# Patient Record
Sex: Male | Born: 1955 | Race: White | Hispanic: No | Smoking: Former smoker
Health system: Southern US, Community
[De-identification: ages and names within clinical notes are randomized; demographics above are authoritative.]

## PROBLEM LIST (undated history)

## (undated) DIAGNOSIS — G35 Multiple sclerosis: Secondary | ICD-10-CM

## (undated) DIAGNOSIS — G259 Extrapyramidal and movement disorder, unspecified: Secondary | ICD-10-CM

## (undated) HISTORY — DX: Extrapyramidal and movement disorder, unspecified: G25.9

## (undated) HISTORY — DX: Multiple sclerosis: G35

---

## 2001-02-13 ENCOUNTER — Ambulatory Visit (HOSPITAL_COMMUNITY): Admission: RE | Admit: 2001-02-13 | Discharge: 2001-02-13 | Payer: Self-pay | Admitting: Preventative Medicine

## 2005-11-22 ENCOUNTER — Inpatient Hospital Stay (HOSPITAL_COMMUNITY): Admission: EM | Admit: 2005-11-22 | Discharge: 2006-02-06 | Payer: Self-pay | Admitting: Emergency Medicine

## 2005-12-12 ENCOUNTER — Ambulatory Visit: Payer: Self-pay | Admitting: Internal Medicine

## 2005-12-13 ENCOUNTER — Ambulatory Visit: Payer: Self-pay | Admitting: Critical Care Medicine

## 2005-12-15 ENCOUNTER — Encounter: Payer: Self-pay | Admitting: Emergency Medicine

## 2005-12-27 ENCOUNTER — Ambulatory Visit: Payer: Self-pay | Admitting: Physical Medicine & Rehabilitation

## 2006-01-19 ENCOUNTER — Ambulatory Visit: Payer: Self-pay | Admitting: Internal Medicine

## 2006-01-31 ENCOUNTER — Ambulatory Visit: Payer: Self-pay | Admitting: Gastroenterology

## 2007-06-14 IMAGING — CR DG CHEST 1V PORT
1 series · 1 of 1 positions shown · non-contrast
Comparison: none

HISTORY: Followup infiltrates, on ventilator

[view not recorded]
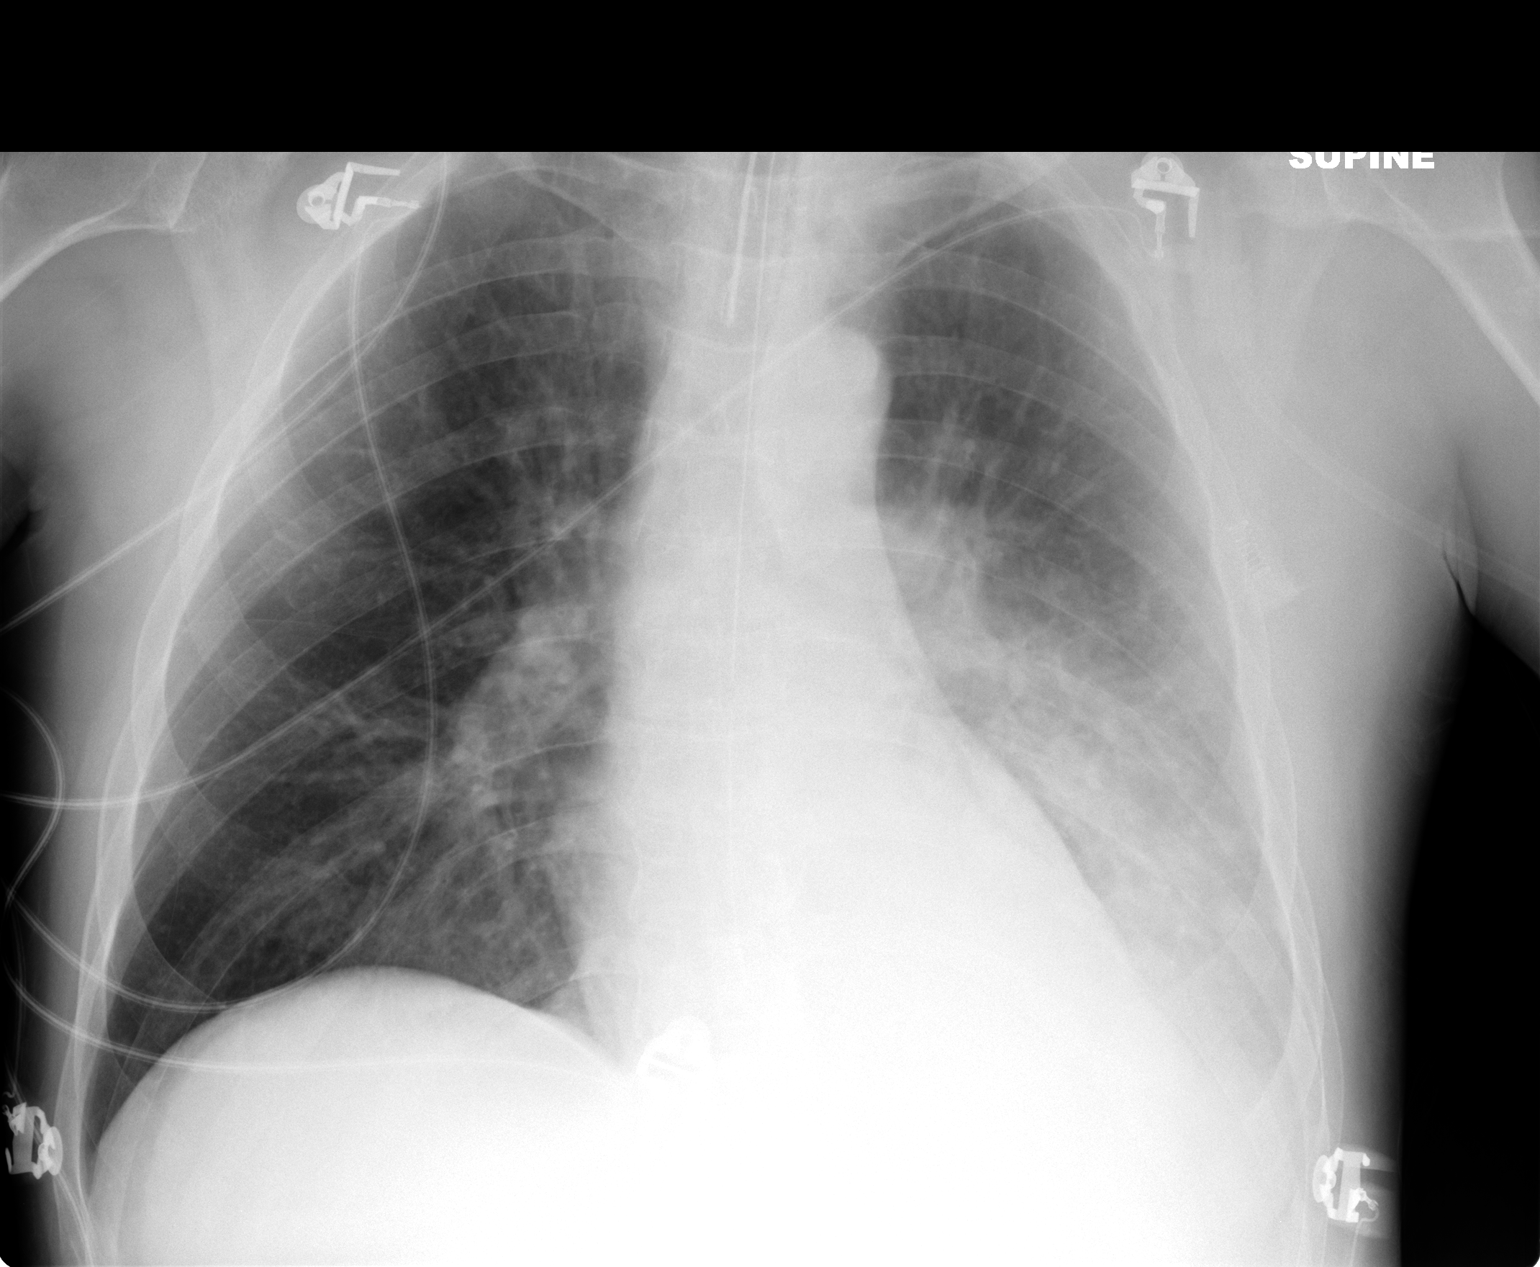

[1 of 1 positions shown; findings below may reference images not displayed]

PORTABLE CHEST ONE VIEW:

Portable exam 2782 hours compared to 11/28/2005

Endotracheal tube in satisfactory position above carina.
Nasogastric tube tip in distal esophagus, recommend advance into stomach.
Heart size stable.
Progressive consolidation left lower lobe.
Minimal atelectasis or infiltrate right base.
IMPRESSION: Recommend advance nasogastric tube into stomach.
Significant increase in left lower lobe consolidation since previous exam.
Minimal right basilar atelectasis or infiltrate.

## 2007-06-16 IMAGING — CR DG CHEST 1V PORT
1 series · 1 of 1 positions shown · non-contrast
Comparison: none

HISTORY: Respiratory failure, on ventilator

[view not recorded]
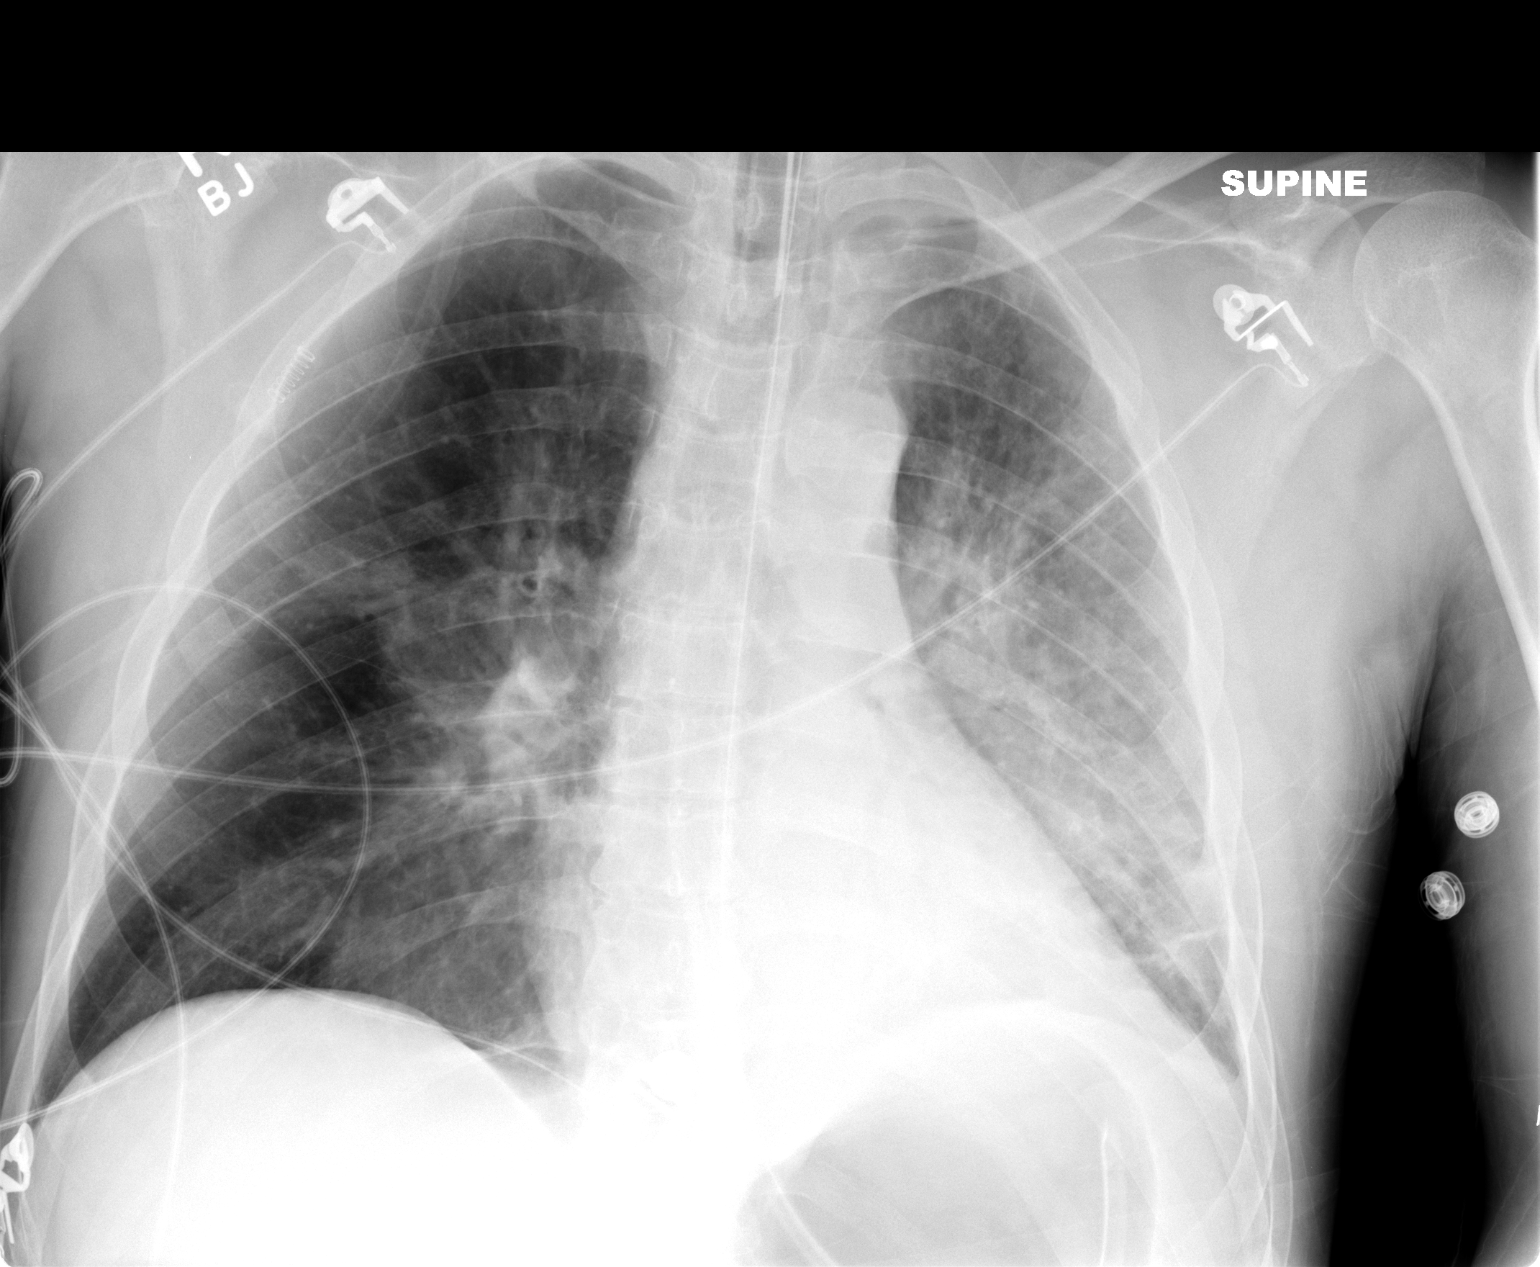

[1 of 1 positions shown; findings below may reference images not displayed]

PORTABLE CHEST ONE VIEW:

Portable exam 0036 hours compared to 11/30/2005

Endotracheal tube in satisfactory position, tip 7.3 cm above carina.
Nasogastric tube in stomach.
Heart size stable.
Bronchitic changes with persistent atelectasis right midlung.
Left lower lobe consolidation, slightly decreased.
Perihilar infiltrate unchanged in left lung.
IMPRESSION: Extensive left lung infiltrates, with slightly improved aeration left base.
Tip of the ET tube is 7.3 cm above carina.

## 2007-06-16 IMAGING — CR DG CHEST 1V PORT SAME DAY
1 series · 1 of 1 positions shown · non-contrast
Comparison: 2112 hours.

CLINICAL DATA: PICC placement.
 PORTABLE CHEST - 1 VIEW:

[view not recorded]
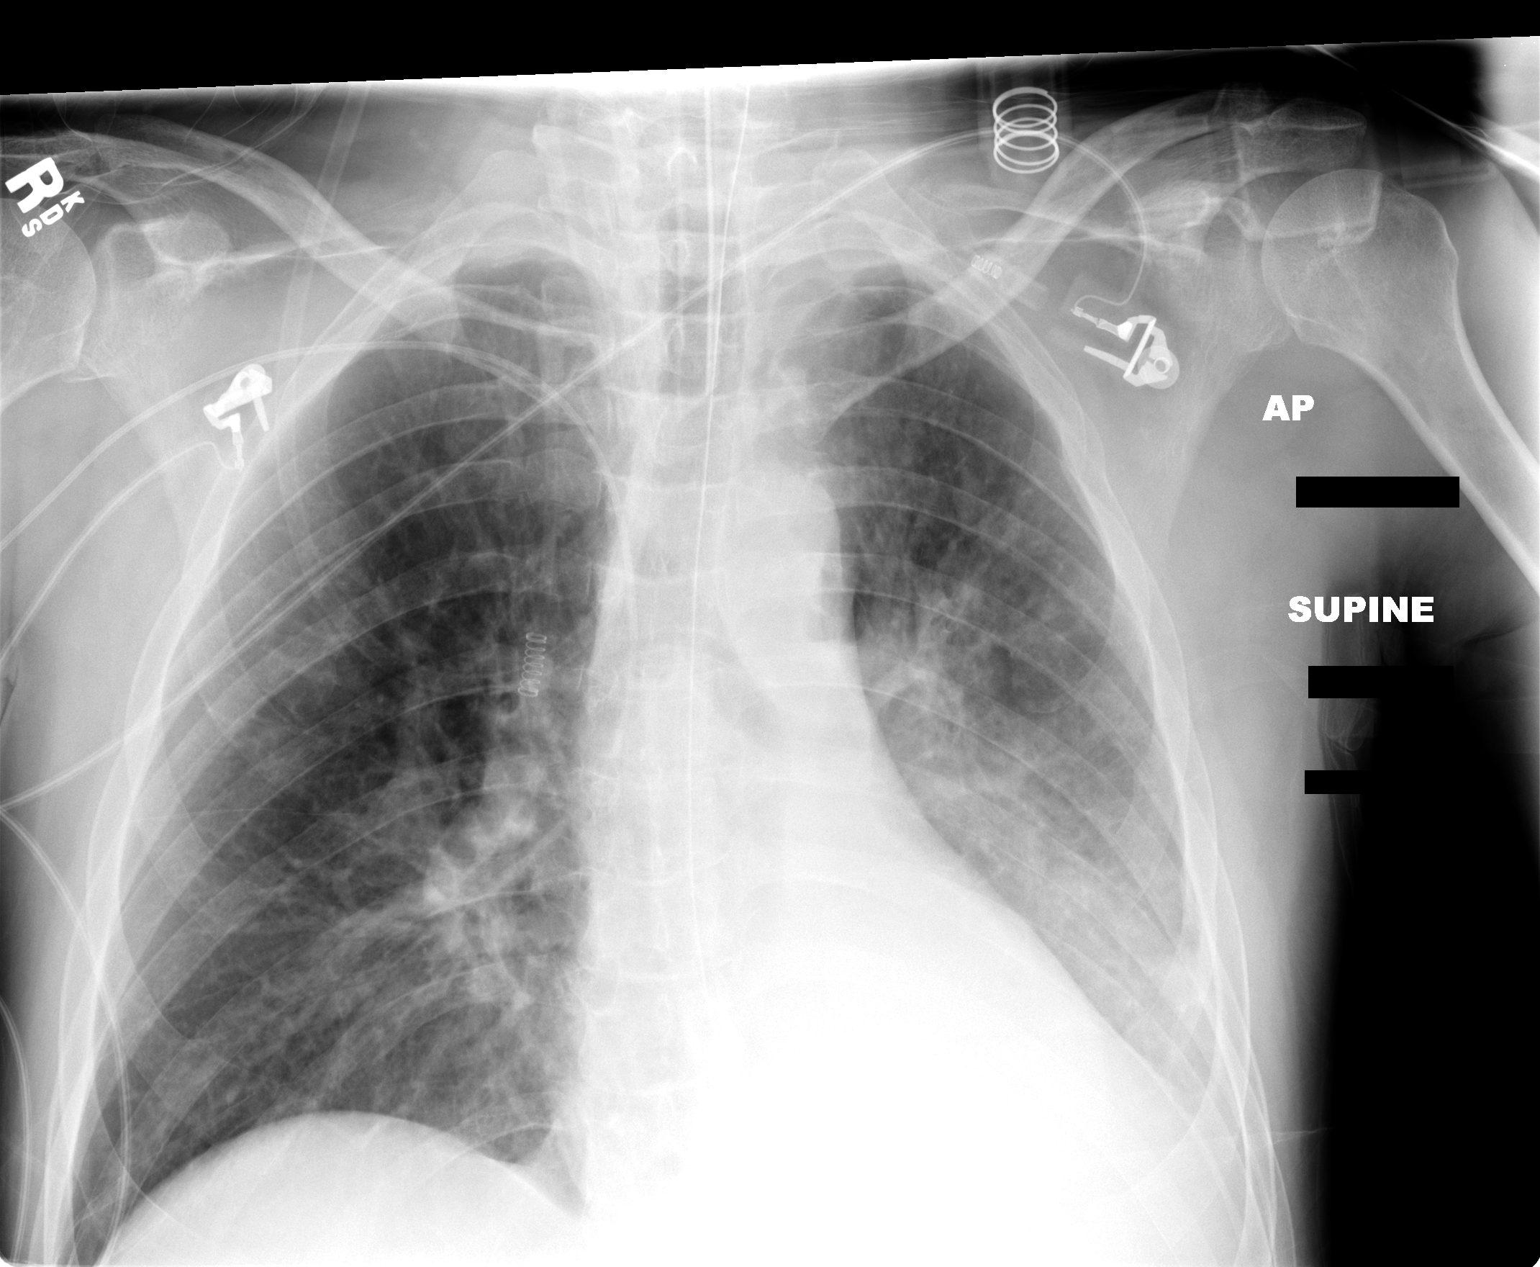

[1 of 1 positions shown; findings below may reference images not displayed]

FINDINGS: A right upper extremity PICC has been placed with its tip in the mid IVC.  There has been interval worsening of the left lower lobe airspace process, with obscuration of the left hemidiaphragm.  ET tube remains in good position.
IMPRESSION: Right upper extremity PICC placed to the mid SVC.  Interval worsening of left lower lobe airspace disease.

## 2007-06-19 IMAGING — CR DG CHEST 1V PORT
1 series · 1 of 1 positions shown · non-contrast
Comparison: 12/02/05

CLINICAL DATA: Altered level of consciousness.  
 PORTABLE CHEST- 1 VIEW (5255 hours):

[view not recorded]
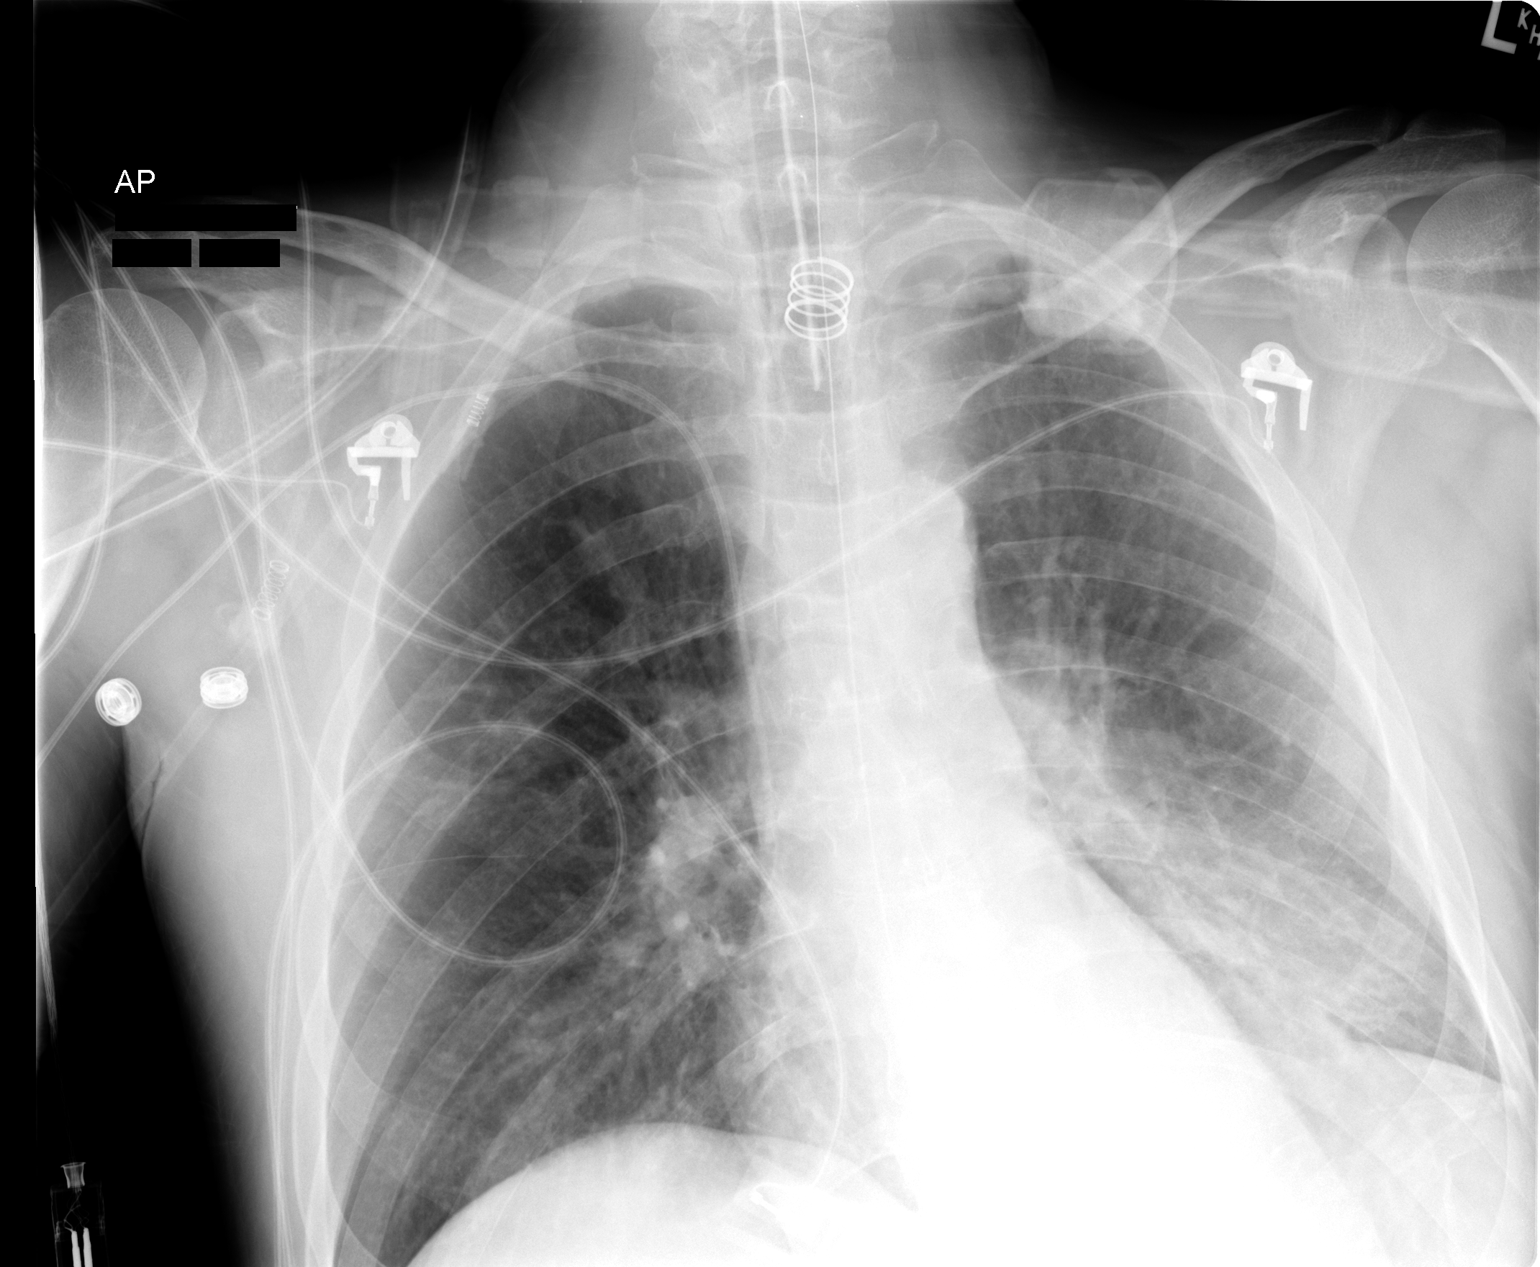

[1 of 1 positions shown; findings below may reference images not displayed]

FINDINGS: The tubular structures are stable.  Airspace disease has worsened in the left lower lobe.  The right lung is clear.  No pneumothoraces or effusions are seen.
IMPRESSION: Worsening left lower lobe airspace disease.

## 2007-07-25 IMAGING — CR DG CHEST 1V PORT
1 series · 1 of 1 positions shown · non-contrast
Comparison: 01/09/06.

CLINICAL DATA: PICC line placement on left side.
CHEST PORTABLE  - 11/13/04 AT 0002 HOURS:

[view not recorded]
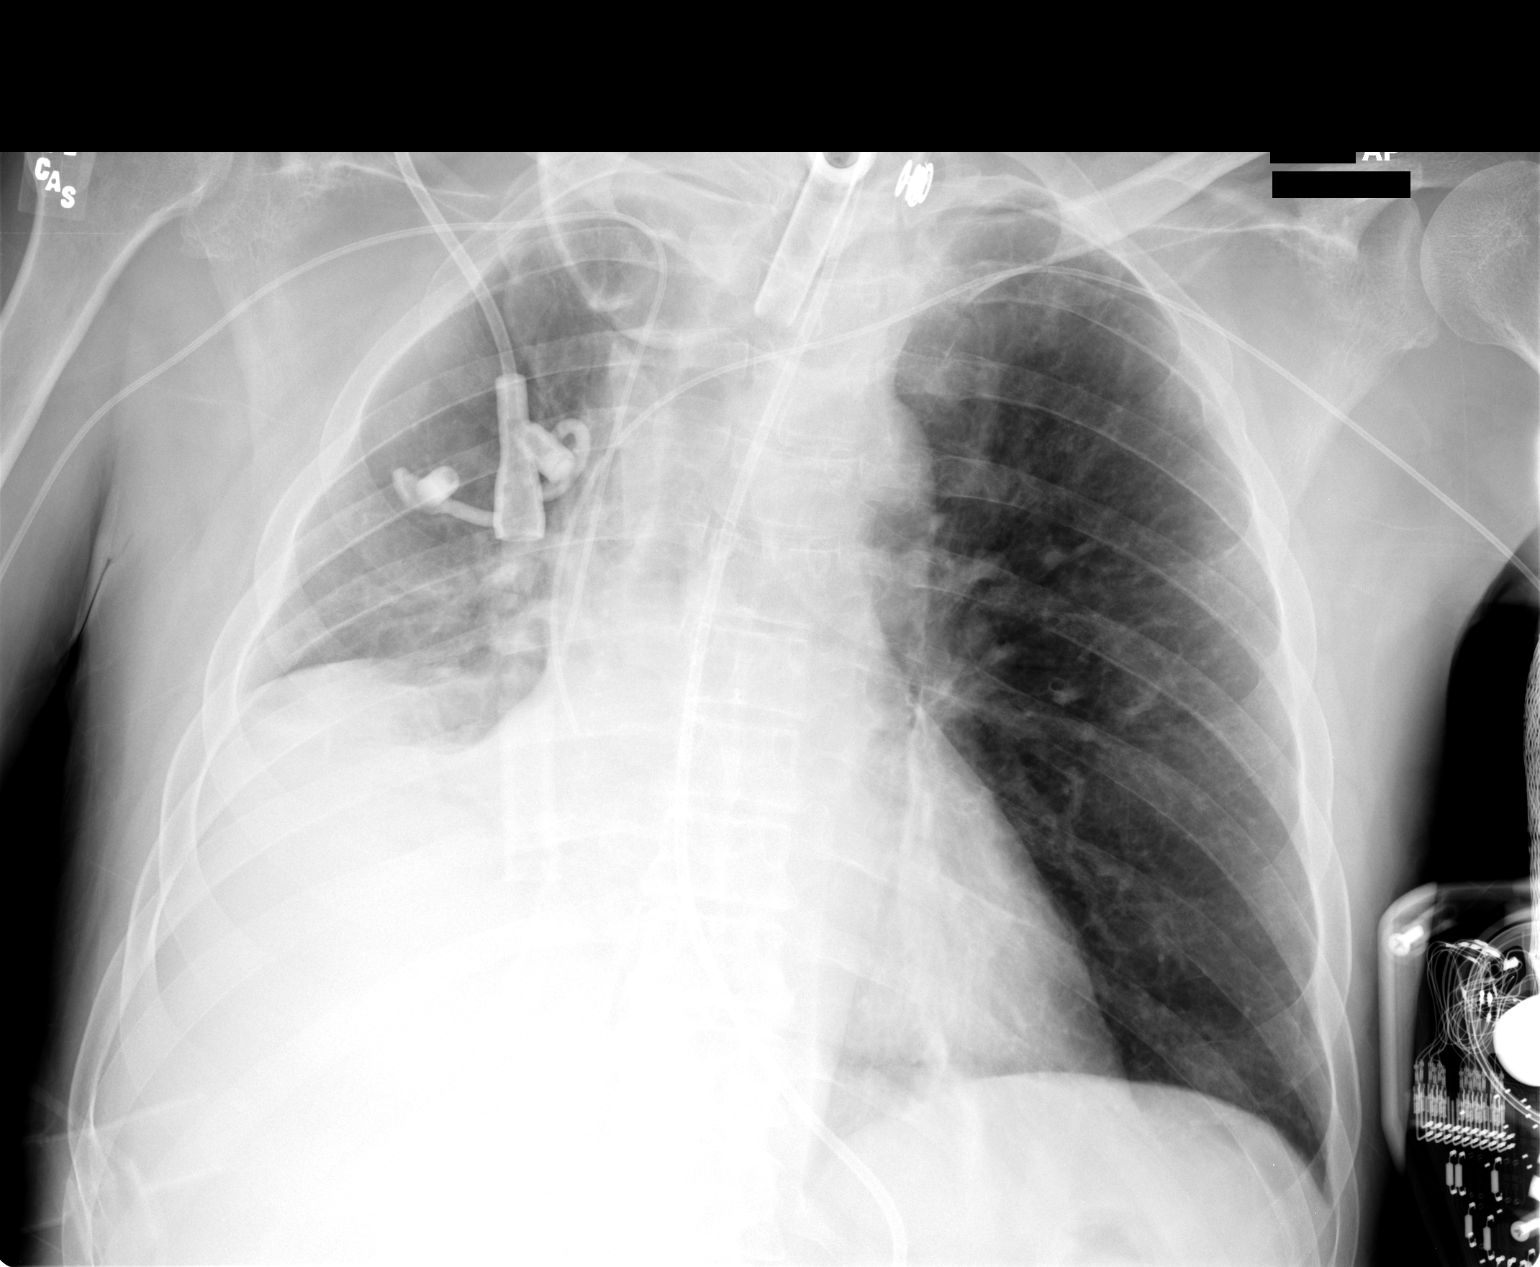

[1 of 1 positions shown; findings below may reference images not displayed]

FINDINGS: A new PICC line has been placed from the left side with the tip in the upper right atrium.  This could be withdrawn 3 cm for placement in the SVC.  The right arm PICC line tip is in the SVC.  
Right lower lobe collapse is again noted.  The left lung remains clear.  The tracheostomy tube is in good position.
IMPRESSION: Left arm PICC line placed into the right atrium, recommend withdrawal of 2-3 cm.

## 2010-10-10 ENCOUNTER — Encounter: Payer: Self-pay | Admitting: Internal Medicine

## 2017-01-22 DIAGNOSIS — K219 Gastro-esophageal reflux disease without esophagitis: Secondary | ICD-10-CM | POA: Diagnosis not present

## 2017-01-22 DIAGNOSIS — A419 Sepsis, unspecified organism: Secondary | ICD-10-CM

## 2017-01-22 DIAGNOSIS — R1310 Dysphagia, unspecified: Secondary | ICD-10-CM

## 2017-01-22 DIAGNOSIS — N390 Urinary tract infection, site not specified: Secondary | ICD-10-CM

## 2017-01-22 DIAGNOSIS — J449 Chronic obstructive pulmonary disease, unspecified: Secondary | ICD-10-CM

## 2017-01-23 DIAGNOSIS — R1310 Dysphagia, unspecified: Secondary | ICD-10-CM | POA: Diagnosis not present

## 2017-01-23 DIAGNOSIS — N390 Urinary tract infection, site not specified: Secondary | ICD-10-CM | POA: Diagnosis not present

## 2017-01-23 DIAGNOSIS — A419 Sepsis, unspecified organism: Secondary | ICD-10-CM | POA: Diagnosis not present

## 2017-01-23 DIAGNOSIS — J449 Chronic obstructive pulmonary disease, unspecified: Secondary | ICD-10-CM | POA: Diagnosis not present

## 2017-01-24 DIAGNOSIS — A419 Sepsis, unspecified organism: Secondary | ICD-10-CM | POA: Diagnosis not present

## 2017-01-24 DIAGNOSIS — N390 Urinary tract infection, site not specified: Secondary | ICD-10-CM | POA: Diagnosis not present

## 2017-01-24 DIAGNOSIS — J449 Chronic obstructive pulmonary disease, unspecified: Secondary | ICD-10-CM | POA: Diagnosis not present

## 2017-01-24 DIAGNOSIS — R1310 Dysphagia, unspecified: Secondary | ICD-10-CM | POA: Diagnosis not present

## 2017-01-25 DIAGNOSIS — N390 Urinary tract infection, site not specified: Secondary | ICD-10-CM | POA: Diagnosis not present

## 2017-01-25 DIAGNOSIS — R1310 Dysphagia, unspecified: Secondary | ICD-10-CM | POA: Diagnosis not present

## 2017-01-25 DIAGNOSIS — J449 Chronic obstructive pulmonary disease, unspecified: Secondary | ICD-10-CM | POA: Diagnosis not present

## 2017-01-25 DIAGNOSIS — A419 Sepsis, unspecified organism: Secondary | ICD-10-CM | POA: Diagnosis not present

## 2017-02-22 ENCOUNTER — Encounter: Payer: Self-pay | Admitting: Neurology

## 2017-03-21 ENCOUNTER — Ambulatory Visit: Payer: Medicare Other | Admitting: Neurology

## 2017-03-23 DIAGNOSIS — K219 Gastro-esophageal reflux disease without esophagitis: Secondary | ICD-10-CM | POA: Diagnosis not present

## 2017-03-23 DIAGNOSIS — G8220 Paraplegia, unspecified: Secondary | ICD-10-CM

## 2017-03-23 DIAGNOSIS — J441 Chronic obstructive pulmonary disease with (acute) exacerbation: Secondary | ICD-10-CM

## 2017-03-23 DIAGNOSIS — R1310 Dysphagia, unspecified: Secondary | ICD-10-CM | POA: Diagnosis not present

## 2017-03-23 DIAGNOSIS — J690 Pneumonitis due to inhalation of food and vomit: Secondary | ICD-10-CM

## 2017-03-23 DIAGNOSIS — J9621 Acute and chronic respiratory failure with hypoxia: Secondary | ICD-10-CM

## 2017-03-23 DIAGNOSIS — Z9189 Other specified personal risk factors, not elsewhere classified: Secondary | ICD-10-CM

## 2017-03-23 DIAGNOSIS — I4891 Unspecified atrial fibrillation: Secondary | ICD-10-CM | POA: Diagnosis not present

## 2017-03-24 DIAGNOSIS — Z9189 Other specified personal risk factors, not elsewhere classified: Secondary | ICD-10-CM | POA: Diagnosis not present

## 2017-03-24 DIAGNOSIS — J441 Chronic obstructive pulmonary disease with (acute) exacerbation: Secondary | ICD-10-CM | POA: Diagnosis not present

## 2017-03-24 DIAGNOSIS — J9621 Acute and chronic respiratory failure with hypoxia: Secondary | ICD-10-CM | POA: Diagnosis not present

## 2017-03-24 DIAGNOSIS — J690 Pneumonitis due to inhalation of food and vomit: Secondary | ICD-10-CM | POA: Diagnosis not present

## 2017-03-25 DIAGNOSIS — J9621 Acute and chronic respiratory failure with hypoxia: Secondary | ICD-10-CM | POA: Diagnosis not present

## 2017-03-25 DIAGNOSIS — Z9189 Other specified personal risk factors, not elsewhere classified: Secondary | ICD-10-CM | POA: Diagnosis not present

## 2017-03-25 DIAGNOSIS — J690 Pneumonitis due to inhalation of food and vomit: Secondary | ICD-10-CM | POA: Diagnosis not present

## 2017-03-25 DIAGNOSIS — J441 Chronic obstructive pulmonary disease with (acute) exacerbation: Secondary | ICD-10-CM | POA: Diagnosis not present

## 2017-03-29 ENCOUNTER — Encounter: Payer: Self-pay | Admitting: Neurology

## 2017-03-29 ENCOUNTER — Other Ambulatory Visit: Payer: Medicare Other

## 2017-03-29 ENCOUNTER — Ambulatory Visit (INDEPENDENT_AMBULATORY_CARE_PROVIDER_SITE_OTHER): Payer: Medicare Other | Admitting: Neurology

## 2017-03-29 VITALS — BP 100/58 | HR 96 | Ht 68.0 in | Wt 157.0 lb

## 2017-03-29 DIAGNOSIS — G35 Multiple sclerosis: Secondary | ICD-10-CM

## 2017-03-29 DIAGNOSIS — G8320 Monoplegia of upper limb affecting unspecified side: Secondary | ICD-10-CM

## 2017-03-29 DIAGNOSIS — K5909 Other constipation: Secondary | ICD-10-CM | POA: Diagnosis not present

## 2017-03-29 DIAGNOSIS — N319 Neuromuscular dysfunction of bladder, unspecified: Secondary | ICD-10-CM

## 2017-03-29 DIAGNOSIS — G8220 Paraplegia, unspecified: Secondary | ICD-10-CM | POA: Diagnosis not present

## 2017-03-29 NOTE — Progress Notes (Signed)
NEUROLOGY CONSULTATION NOTE  Barry Hernandez MRN: [id number] DOB: 10/01/1955  Referring provider: Dr. Ardelle Park Primary care provider: Dr. Ardelle Park  Reason for consult:  Multiple sclerosis  HISTORY OF PRESENT ILLNESS: Barry Hernandez is a 61 year old right-handed male with atrial fibrillation, HTN and hyperlipidemia who presents for multiple sclerosis.  History supplemented by PCP note.  He was diagnosed with MS at age 42.  He initially presented with difficulty climbing stairs.  He subsequently required a cane, then walker and then wheelchair.  He underwent an MRI of the brain and was told by a neurologist that he had progressive MS.  He does not recall having a lumbar puncture.  He never followed up with neurology and has never been on disease modifying therapy.  He has had gradually decline with loss of function.  He has been living at [facility] for the past 11 years.  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  He is non-ambulatory and relies on a motorized wheelchair for the past 10 years.  He has neurogenic bladder.  He has dysphagia with prior PEG placement.  For pain due to muscle spasms, he takes baclofen 20mg , gabapentin 300mg  three times daily and Norco. A modified barium swallow from May revealed no evidence of aspiration and is currently on a regular diet with thin liquids.  He is also followed by speech therapy as well. For neurogenic bladder, he takes urecholine 50mg  TID and Flomax 0.4mg  BID.  Due to this, he has recurrent cystitis.  He is followed by urology.  PAST MEDICAL HISTORY: Past Medical History:  Diagnosis Date  . Movement disorder   . Multiple sclerosis (HCC)     PAST SURGICAL HISTORY: No past surgical history on file.  MEDICATIONS: No current outpatient prescriptions on file prior to visit.   No current facility-administered medications on file prior to visit.     ALLERGIES: Not on File  FAMILY HISTORY: Family History    Problem Relation Age of Onset  . Cancer - Other Mother        brain  . Cancer - Other Father        throat    SOCIAL HISTORY: Social History   Social History  . Marital status: Divorced    Spouse name: N/A  . Number of children: N/A  . Years of education: N/A   Occupational History  . Not on file.   Social History Main Topics  . Smoking status: Former Smoker    Types: Cigarettes  . Smokeless tobacco: Never Used  . Alcohol use Not on file  . Drug use: Unknown  . Sexual activity: Not on file   Other Topics Concern  . Not on file   Social History Narrative  . No narrative on file    REVIEW OF SYSTEMS: Constitutional: No fevers, chills, or sweats, no generalized fatigue, change in appetite Eyes: No visual changes, double vision, eye pain Ear, nose and throat: No hearing loss, ear pain, nasal congestion, sore throat Cardiovascular: No chest pain, palpitations Respiratory:  No shortness of breath at rest or with exertion, wheezes GastrointestinaI: constipation Genitourinary:  Neurogenic bladder Musculoskeletal:  Spasticity, pain Integumentary: No rash, pruritus, skin lesions Neurological: as above Psychiatric: No depression, insomnia, anxiety Endocrine: No palpitations, fatigue, diaphoresis, mood swings, change in appetite, change in weight, increased thirst Hematologic/Lymphatic:  No purpura, petechiae. Allergic/Immunologic: no itchy/runny eyes, nasal congestion, recent allergic reactions, rashes  PHYSICAL EXAM: Vitals:   03/29/17 0755  BP: (!) 100/58  Pulse: 96   General: No acute distress.   Head:  Normocephalic/atraumatic Eyes:  fundi examined but not visualized Neck: supple, no paraspinal tenderness, full range of motion Back: No paraspinal tenderness Heart: regular rate and rhythm Lungs: Clear to auscultation bilaterally. Vascular: No carotid bruits. Neurological Exam: Mental status: alert and oriented to person and place (knew month but said year  was 1807), recent and remote memory fair, fund of knowledge intact, attention and concentration intact, speech fluent with spastic dysarthria, language intact. Cranial nerves: CN I: not tested CN II: pupils equal, round and reactive to light, visual fields intact CN III, IV, VI:  full range of motion, no nystagmus, no ptosis CN V: facial sensation intact CN VII: upper and lower face symmetric CN VIII: hearing intact CN IX, X: gag intact, uvula midline CN XI: sternocleidomastoid and trapezius muscles intact CN XII: tongue midline Bulk & Tone: increased tone and spasticity in right upper extremity and lower extremities, no fasciculations. Motor:  Right upper extremity contracture with arm flexed, 5/5 left upper extremity, 1/5 bilateral lower extremities Sensation: temperature and vibration sensation reduced in lower extremities. Deep Tendon Reflexes:  3+ throughout, toes downgoing. Finger to nose testing:  Left upper extremity with tremor but not dysmetria.  Unable to assess right upper extremity Heel to shin:  Unable to assess Gait:  Non-ambulatory.  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  To help verify diagnosis, we will get MRI of brain/cervical/thoracic spine with and without contrast 2.  I want to see him back after testing to discuss treatment options.  He may be a candidate of Ocrevus, which is a disease modifying therapy indicated for primary progressive MS. 3.  Continue gabapentin and baclofen for spasticity and pain.  Botox is an option for his right upper extremity, but this may not be an option as he is on anticoagulation for Afib (may require discontinuing medication for a couple of days prior to injection, would have to check) 4.  We will also check vitamin D level 5.  Treatment for neurogenic  bladder/BPH as per urology.  Thank you for allowing me to take part in the care of this patient.  Shon Millet, DO  CC:  Shelbie Ammons, MD

## 2017-03-29 NOTE — Patient Instructions (Signed)
1.  I want to get MRI of brain, cervical and thoracic spine with and without contrast. 2.  I want to check vitamin D level 3.  I want to see you back after MRI to discuss further management.

## 2017-04-01 LAB — VITAMIN D 1,25 DIHYDROXY
Vitamin D 1, 25 (OH)2 Total: 34 pg/mL (ref 18–72)
Vitamin D2 1, 25 (OH)2: <8 pg/mL
Vitamin D3 1, 25 (OH)2: 34 pg/mL

## 2017-04-03 ENCOUNTER — Other Ambulatory Visit: Payer: Self-pay | Admitting: *Deleted

## 2017-04-03 ENCOUNTER — Telehealth: Payer: Self-pay | Admitting: *Deleted

## 2017-04-03 MED ORDER — VITAMIN D3 50 MCG (2000 UT) PO TABS: 1.0000 | ORAL_TABLET | Freq: Every day | ORAL | 2 refills | Status: AC

## 2017-04-03 MED ORDER — VITAMIN D3 50 MCG (2000 UT) PO TABS: 1.0000 | ORAL_TABLET | Freq: Every day | ORAL | 3 refills | Status: AC

## 2017-04-03 NOTE — Telephone Encounter (Signed)
-----   Message from Drema Dallas, DO sent at 04/03/2017  7:11 AM EDT ----- Please have Mr. Broadnax take D3 2000 IU daily.  Level is normal range (34), but I would like it greater than 50.

## 2017-04-03 NOTE — Telephone Encounter (Signed)
I spoke with Shanda Bumps at the patient's rehab facility and gave her the instructions per Dr. Everlena Cooper.  I will fax new order to her at [phone].

## 2017-04-26 ENCOUNTER — Telehealth: Payer: Self-pay | Admitting: Neurology

## 2017-04-26 NOTE — Telephone Encounter (Signed)
Barry Hernandez with [facility] called and wanted to know if PT's MRI has been scheduled yet CB# [phone]

## 2017-04-27 NOTE — Telephone Encounter (Signed)
Luna Kitchens from [facility] called again. After speaking w/me yesterday and I gave her the telephone number to [facility], she called to check on MRI scheduling status, she was told the imaging has not been scheduled because they were unable to read the order. Called [facility], spoke w/Brianna. She advsd whe orders were put in, they were submitted as external orders, apparently Drenda Freeze at [facility] corrected that and is now ok to schedule. I asked Colin Mulders to contact Luna Kitchens at [facility] @ [phone], she stated she would.

## 2017-04-27 NOTE — Telephone Encounter (Signed)
Called [facility]. to verify [facility] called to schedule MRI's-spoke w/Latasha. MRI's are scheduled for 05/11/17

## 2017-05-11 ENCOUNTER — Other Ambulatory Visit: Payer: Medicare Other

## 2017-05-11 ENCOUNTER — Inpatient Hospital Stay: Admission: RE | Admit: 2017-05-11 | Payer: Medicare Other | Source: Ambulatory Visit

## 2017-05-20 ENCOUNTER — Other Ambulatory Visit: Payer: Medicare Other

## 2017-05-20 ENCOUNTER — Inpatient Hospital Stay: Admission: RE | Admit: 2017-05-20 | Payer: Medicare Other | Source: Ambulatory Visit

## 2017-05-30 ENCOUNTER — Other Ambulatory Visit: Payer: Medicare Other

## 2017-08-04 ENCOUNTER — Other Ambulatory Visit: Payer: Medicare Other

## 2017-08-04 ENCOUNTER — Inpatient Hospital Stay: Admission: RE | Admit: 2017-08-04 | Payer: Medicare Other | Source: Ambulatory Visit

## 2017-08-07 ENCOUNTER — Telehealth: Payer: Self-pay

## 2017-08-07 NOTE — Telephone Encounter (Signed)
[place] [facility] She said Pt. Had an incident last week while there for tests and had to have emergency treatment, she wanted to know if imaging studies were still wanted CB# [phone]. Rtrnd her call, was trx to an ext that the VM was not able to receive messages, calleed back, spoke w/Becky, LM for Latasha to rtrn my call

## 2017-08-08 NOTE — Telephone Encounter (Signed)
Barry Hernandez rtrnd my call. Clarification on her message yesterday, as Pt was being readied for transport for imaging studies, her became extremly lethargic and had to be started on IV fluids and they decided not to transport him. Barry Hernandez is to call and reschedule appointment for imaging.

## 2017-11-26 DIAGNOSIS — K921 Melena: Secondary | ICD-10-CM

## 2017-11-26 DIAGNOSIS — J690 Pneumonitis due to inhalation of food and vomit: Secondary | ICD-10-CM | POA: Diagnosis not present

## 2017-11-26 DIAGNOSIS — J181 Lobar pneumonia, unspecified organism: Secondary | ICD-10-CM

## 2017-11-26 DIAGNOSIS — G92 Toxic encephalopathy: Secondary | ICD-10-CM | POA: Diagnosis not present

## 2017-11-26 DIAGNOSIS — N390 Urinary tract infection, site not specified: Secondary | ICD-10-CM | POA: Diagnosis not present

## 2017-11-26 DIAGNOSIS — J9621 Acute and chronic respiratory failure with hypoxia: Secondary | ICD-10-CM | POA: Diagnosis not present

## 2017-11-26 DIAGNOSIS — J440 Chronic obstructive pulmonary disease with acute lower respiratory infection: Secondary | ICD-10-CM

## 2017-11-26 DIAGNOSIS — N179 Acute kidney failure, unspecified: Secondary | ICD-10-CM

## 2017-11-26 DIAGNOSIS — J1000 Influenza due to other identified influenza virus with unspecified type of pneumonia: Secondary | ICD-10-CM

## 2017-11-26 DIAGNOSIS — E872 Acidosis: Secondary | ICD-10-CM

## 2017-11-26 DIAGNOSIS — R0602 Shortness of breath: Secondary | ICD-10-CM

## 2017-11-26 DIAGNOSIS — J9622 Acute and chronic respiratory failure with hypercapnia: Secondary | ICD-10-CM

## 2017-11-26 DIAGNOSIS — R532 Functional quadriplegia: Secondary | ICD-10-CM

## 2017-11-26 DIAGNOSIS — A419 Sepsis, unspecified organism: Secondary | ICD-10-CM

## 2017-11-26 DIAGNOSIS — R6521 Severe sepsis with septic shock: Secondary | ICD-10-CM | POA: Diagnosis not present

## 2017-11-27 DIAGNOSIS — R6521 Severe sepsis with septic shock: Secondary | ICD-10-CM | POA: Diagnosis not present

## 2017-11-27 DIAGNOSIS — R532 Functional quadriplegia: Secondary | ICD-10-CM | POA: Diagnosis not present

## 2017-11-27 DIAGNOSIS — A419 Sepsis, unspecified organism: Secondary | ICD-10-CM | POA: Diagnosis not present

## 2017-11-27 DIAGNOSIS — J1000 Influenza due to other identified influenza virus with unspecified type of pneumonia: Secondary | ICD-10-CM | POA: Diagnosis not present

## 2017-11-28 DIAGNOSIS — R532 Functional quadriplegia: Secondary | ICD-10-CM | POA: Diagnosis not present

## 2017-11-28 DIAGNOSIS — J1000 Influenza due to other identified influenza virus with unspecified type of pneumonia: Secondary | ICD-10-CM | POA: Diagnosis not present

## 2017-11-28 DIAGNOSIS — R6521 Severe sepsis with septic shock: Secondary | ICD-10-CM | POA: Diagnosis not present

## 2017-11-28 DIAGNOSIS — A419 Sepsis, unspecified organism: Secondary | ICD-10-CM | POA: Diagnosis not present

## 2017-11-29 DIAGNOSIS — N179 Acute kidney failure, unspecified: Secondary | ICD-10-CM | POA: Diagnosis not present

## 2017-11-29 DIAGNOSIS — J9621 Acute and chronic respiratory failure with hypoxia: Secondary | ICD-10-CM | POA: Diagnosis not present

## 2017-11-29 DIAGNOSIS — G92 Toxic encephalopathy: Secondary | ICD-10-CM | POA: Diagnosis not present

## 2017-11-29 DIAGNOSIS — J690 Pneumonitis due to inhalation of food and vomit: Secondary | ICD-10-CM | POA: Diagnosis not present

## 2017-11-29 DIAGNOSIS — J181 Lobar pneumonia, unspecified organism: Secondary | ICD-10-CM | POA: Diagnosis not present

## 2017-11-29 DIAGNOSIS — R6521 Severe sepsis with septic shock: Secondary | ICD-10-CM | POA: Diagnosis not present

## 2017-11-29 DIAGNOSIS — N390 Urinary tract infection, site not specified: Secondary | ICD-10-CM | POA: Diagnosis not present

## 2017-11-29 DIAGNOSIS — J9622 Acute and chronic respiratory failure with hypercapnia: Secondary | ICD-10-CM | POA: Diagnosis not present

## 2017-11-29 DIAGNOSIS — R532 Functional quadriplegia: Secondary | ICD-10-CM | POA: Diagnosis not present

## 2017-11-29 DIAGNOSIS — E872 Acidosis: Secondary | ICD-10-CM | POA: Diagnosis not present

## 2017-11-29 DIAGNOSIS — A419 Sepsis, unspecified organism: Secondary | ICD-10-CM | POA: Diagnosis not present

## 2017-11-29 DIAGNOSIS — J1000 Influenza due to other identified influenza virus with unspecified type of pneumonia: Secondary | ICD-10-CM | POA: Diagnosis not present

## 2017-11-30 DIAGNOSIS — R6521 Severe sepsis with septic shock: Secondary | ICD-10-CM | POA: Diagnosis not present

## 2017-11-30 DIAGNOSIS — R532 Functional quadriplegia: Secondary | ICD-10-CM | POA: Diagnosis not present

## 2017-11-30 DIAGNOSIS — A419 Sepsis, unspecified organism: Secondary | ICD-10-CM | POA: Diagnosis not present

## 2017-11-30 DIAGNOSIS — J1000 Influenza due to other identified influenza virus with unspecified type of pneumonia: Secondary | ICD-10-CM | POA: Diagnosis not present

## 2017-12-01 DIAGNOSIS — A419 Sepsis, unspecified organism: Secondary | ICD-10-CM | POA: Diagnosis not present

## 2017-12-01 DIAGNOSIS — R6521 Severe sepsis with septic shock: Secondary | ICD-10-CM | POA: Diagnosis not present

## 2017-12-01 DIAGNOSIS — J1000 Influenza due to other identified influenza virus with unspecified type of pneumonia: Secondary | ICD-10-CM | POA: Diagnosis not present

## 2017-12-01 DIAGNOSIS — R532 Functional quadriplegia: Secondary | ICD-10-CM | POA: Diagnosis not present

## 2017-12-02 DIAGNOSIS — R6521 Severe sepsis with septic shock: Secondary | ICD-10-CM | POA: Diagnosis not present

## 2017-12-02 DIAGNOSIS — A419 Sepsis, unspecified organism: Secondary | ICD-10-CM | POA: Diagnosis not present

## 2017-12-02 DIAGNOSIS — R532 Functional quadriplegia: Secondary | ICD-10-CM | POA: Diagnosis not present

## 2017-12-02 DIAGNOSIS — J1000 Influenza due to other identified influenza virus with unspecified type of pneumonia: Secondary | ICD-10-CM | POA: Diagnosis not present

## 2017-12-03 DIAGNOSIS — R6521 Severe sepsis with septic shock: Secondary | ICD-10-CM | POA: Diagnosis not present

## 2017-12-03 DIAGNOSIS — J1000 Influenza due to other identified influenza virus with unspecified type of pneumonia: Secondary | ICD-10-CM | POA: Diagnosis not present

## 2017-12-03 DIAGNOSIS — A419 Sepsis, unspecified organism: Secondary | ICD-10-CM | POA: Diagnosis not present

## 2017-12-03 DIAGNOSIS — R532 Functional quadriplegia: Secondary | ICD-10-CM | POA: Diagnosis not present

## 2017-12-04 DIAGNOSIS — R532 Functional quadriplegia: Secondary | ICD-10-CM | POA: Diagnosis not present

## 2017-12-04 DIAGNOSIS — A419 Sepsis, unspecified organism: Secondary | ICD-10-CM | POA: Diagnosis not present

## 2017-12-04 DIAGNOSIS — J1000 Influenza due to other identified influenza virus with unspecified type of pneumonia: Secondary | ICD-10-CM | POA: Diagnosis not present

## 2017-12-04 DIAGNOSIS — R6521 Severe sepsis with septic shock: Secondary | ICD-10-CM | POA: Diagnosis not present

## 2017-12-05 DIAGNOSIS — J1000 Influenza due to other identified influenza virus with unspecified type of pneumonia: Secondary | ICD-10-CM | POA: Diagnosis not present

## 2017-12-05 DIAGNOSIS — R532 Functional quadriplegia: Secondary | ICD-10-CM | POA: Diagnosis not present

## 2017-12-05 DIAGNOSIS — R6521 Severe sepsis with septic shock: Secondary | ICD-10-CM | POA: Diagnosis not present

## 2017-12-05 DIAGNOSIS — A419 Sepsis, unspecified organism: Secondary | ICD-10-CM | POA: Diagnosis not present

## 2017-12-18 DEATH — deceased
